# Patient Record
Sex: Female | Born: 1945 | Race: White | Hispanic: No | Marital: Married | Smoking: Never smoker
Health system: Southern US, Community
[De-identification: ages and names within clinical notes are randomized; demographics above are authoritative.]

## PROBLEM LIST (undated history)

## (undated) DIAGNOSIS — T7840XA Allergy, unspecified, initial encounter: Secondary | ICD-10-CM

## (undated) DIAGNOSIS — N809 Endometriosis, unspecified: Secondary | ICD-10-CM

## (undated) DIAGNOSIS — A498 Other bacterial infections of unspecified site: Secondary | ICD-10-CM

## (undated) DIAGNOSIS — E785 Hyperlipidemia, unspecified: Secondary | ICD-10-CM

## (undated) DIAGNOSIS — M810 Age-related osteoporosis without current pathological fracture: Secondary | ICD-10-CM

## (undated) HISTORY — DX: Allergy, unspecified, initial encounter: T78.40XA

## (undated) HISTORY — DX: Age-related osteoporosis without current pathological fracture: M81.0

## (undated) HISTORY — PX: DILATION AND CURETTAGE OF UTERUS: SHX78

## (undated) HISTORY — PX: FOOT SURGERY: SHX648

## (undated) HISTORY — DX: Hyperlipidemia, unspecified: E78.5

## (undated) HISTORY — DX: Other bacterial infections of unspecified site: A49.8

## (undated) HISTORY — DX: Endometriosis, unspecified: N80.9

## (undated) HISTORY — PX: COLONOSCOPY: SHX174

---

## 1998-09-15 ENCOUNTER — Other Ambulatory Visit: Admission: RE | Admit: 1998-09-15 | Discharge: 1998-09-15 | Payer: Self-pay | Admitting: Obstetrics and Gynecology

## 1998-11-17 ENCOUNTER — Other Ambulatory Visit: Admission: RE | Admit: 1998-11-17 | Discharge: 1998-11-17 | Payer: Self-pay | Admitting: Obstetrics and Gynecology

## 1999-10-19 ENCOUNTER — Other Ambulatory Visit: Admission: RE | Admit: 1999-10-19 | Discharge: 1999-10-19 | Payer: Self-pay | Admitting: Obstetrics and Gynecology

## 1999-11-23 ENCOUNTER — Encounter: Payer: Self-pay | Admitting: Obstetrics and Gynecology

## 1999-11-23 ENCOUNTER — Encounter: Admission: RE | Admit: 1999-11-23 | Discharge: 1999-11-23 | Payer: Self-pay | Admitting: Obstetrics and Gynecology

## 2000-12-05 ENCOUNTER — Other Ambulatory Visit: Admission: RE | Admit: 2000-12-05 | Discharge: 2000-12-05 | Payer: Self-pay | Admitting: Obstetrics and Gynecology

## 2001-01-02 ENCOUNTER — Encounter: Admission: RE | Admit: 2001-01-02 | Discharge: 2001-01-02 | Payer: Self-pay | Admitting: Obstetrics and Gynecology

## 2001-01-02 ENCOUNTER — Encounter: Payer: Self-pay | Admitting: Obstetrics and Gynecology

## 2002-02-26 ENCOUNTER — Encounter: Admission: RE | Admit: 2002-02-26 | Discharge: 2002-02-26 | Payer: Self-pay | Admitting: Obstetrics and Gynecology

## 2002-02-26 ENCOUNTER — Encounter: Payer: Self-pay | Admitting: Obstetrics and Gynecology

## 2003-04-22 ENCOUNTER — Encounter: Admission: RE | Admit: 2003-04-22 | Discharge: 2003-04-22 | Payer: Self-pay | Admitting: Obstetrics and Gynecology

## 2003-04-22 ENCOUNTER — Encounter: Payer: Self-pay | Admitting: Obstetrics and Gynecology

## 2003-04-27 ENCOUNTER — Encounter: Payer: Self-pay | Admitting: Obstetrics and Gynecology

## 2003-04-27 ENCOUNTER — Encounter: Admission: RE | Admit: 2003-04-27 | Discharge: 2003-04-27 | Payer: Self-pay | Admitting: Obstetrics and Gynecology

## 2004-04-27 ENCOUNTER — Ambulatory Visit (HOSPITAL_COMMUNITY): Admission: RE | Admit: 2004-04-27 | Discharge: 2004-04-27 | Payer: Self-pay | Admitting: Obstetrics and Gynecology

## 2004-05-21 ENCOUNTER — Encounter: Admission: RE | Admit: 2004-05-21 | Discharge: 2004-05-21 | Payer: Self-pay | Admitting: Specialist

## 2004-12-17 ENCOUNTER — Ambulatory Visit: Payer: Self-pay | Admitting: Internal Medicine

## 2004-12-24 ENCOUNTER — Ambulatory Visit: Payer: Self-pay | Admitting: Internal Medicine

## 2005-06-12 ENCOUNTER — Ambulatory Visit (HOSPITAL_COMMUNITY): Admission: RE | Admit: 2005-06-12 | Discharge: 2005-06-12 | Payer: Self-pay | Admitting: Obstetrics and Gynecology

## 2005-08-26 ENCOUNTER — Encounter (INDEPENDENT_AMBULATORY_CARE_PROVIDER_SITE_OTHER): Payer: Self-pay | Admitting: Specialist

## 2005-08-26 ENCOUNTER — Ambulatory Visit (HOSPITAL_BASED_OUTPATIENT_CLINIC_OR_DEPARTMENT_OTHER): Admission: RE | Admit: 2005-08-26 | Discharge: 2005-08-26 | Payer: Self-pay | Admitting: Otolaryngology

## 2005-08-26 ENCOUNTER — Ambulatory Visit (HOSPITAL_COMMUNITY): Admission: RE | Admit: 2005-08-26 | Discharge: 2005-08-26 | Payer: Self-pay | Admitting: Otolaryngology

## 2006-06-23 ENCOUNTER — Ambulatory Visit (HOSPITAL_COMMUNITY): Admission: RE | Admit: 2006-06-23 | Discharge: 2006-06-23 | Payer: Self-pay | Admitting: Obstetrics and Gynecology

## 2006-11-05 ENCOUNTER — Ambulatory Visit (HOSPITAL_COMMUNITY): Admission: RE | Admit: 2006-11-05 | Discharge: 2006-11-05 | Payer: Self-pay | Admitting: Internal Medicine

## 2007-07-01 ENCOUNTER — Ambulatory Visit (HOSPITAL_COMMUNITY): Admission: RE | Admit: 2007-07-01 | Discharge: 2007-07-01 | Payer: Self-pay | Admitting: Obstetrics and Gynecology

## 2008-07-06 ENCOUNTER — Ambulatory Visit (HOSPITAL_COMMUNITY): Admission: RE | Admit: 2008-07-06 | Discharge: 2008-07-06 | Payer: Self-pay | Admitting: Obstetrics and Gynecology

## 2009-07-19 ENCOUNTER — Ambulatory Visit (HOSPITAL_COMMUNITY): Admission: RE | Admit: 2009-07-19 | Discharge: 2009-07-19 | Payer: Self-pay | Admitting: Obstetrics and Gynecology

## 2010-09-19 ENCOUNTER — Ambulatory Visit (HOSPITAL_COMMUNITY)
Admission: RE | Admit: 2010-09-19 | Discharge: 2010-09-19 | Payer: Self-pay | Source: Home / Self Care | Attending: Obstetrics and Gynecology | Admitting: Obstetrics and Gynecology

## 2010-11-03 ENCOUNTER — Encounter: Payer: Self-pay | Admitting: Obstetrics and Gynecology

## 2010-11-04 ENCOUNTER — Encounter: Payer: Self-pay | Admitting: Obstetrics and Gynecology

## 2011-03-01 NOTE — Op Note (Signed)
Kathy Francis, Kathy Francis                ACCOUNT NO.:  [id number]   MEDICAL RECORD NO.:  [id number]            PATIENT TYPE:   LOCATION:                                 FACILITY:   PHYSICIAN:  Jefry H. Pollyann Kennedy, MD          DATE OF BIRTH:   DATE OF PROCEDURE:  08/26/2005  DATE OF DISCHARGE:                                 OPERATIVE REPORT   PREOPERATIVE DIAGNOSIS:  Right nasoalveolar cyst.   POSTOPERATIVE DIAGNOSIS:  Right nasoalveolar cyst.   PROCEDURE:  Excision of right nasoalveolar cyst using a sublabial approach.   SURGEON:  Jefry H. Pollyann Kennedy, MD   ANESTHESIA:  General endotracheal anesthesia.   COMPLICATIONS:  None.   FINDINGS:  A 2-3 cm cystic mass in the nasoalveolar area without any  invasion into any bone.  There did appear to be some minor bony remodeling  along the alveolar bone just inferior to the piriform aperture.   SPECIMENS TO PATHOLOGY:  Alveolar cyst.   COMPLICATIONS:  None.   BLOOD LOSS:  Minimal.   HISTORY:  This is a 65 year old lady with a slowly emerging mass on the  right side of the face that has caused some cosmetic changes of the nose and  cheek area.  The risks, benefits, alternatives, and complications of the  procedure were explained to the patient.  She seemed to understand and  agrees to surgery.   DESCRIPTION OF PROCEDURE:  The patient was taken to the operating room and  placed on the operating table in the supine position.  Following induction  of general endotracheal anesthesia the right side of the face was prepped  and draped in a standard fashion.  Xylocaine 1% with epinephrine was  infiltrated into the pyriform fossa mucosa. The electrocautery unit was used  to incise the mucosa; and through the superficial layer of muscle down to  the wall of the cyst.  The cyst wall was carefully dissected free of all  surrounding tissue; and it was removed in its entirety. There was leaking of  consents and there was cloudy fluid within the cyst.  The  entire cyst was  removed and was sent  for pathologic evaluation.  Electrocautery was used for completion of  hemostasis.  The wound was irrigated with saline and closed with a running,  interlocking 4-0 Vicryl suture.  The patient was then awakened, extubated,  and transferred to recovery.      Jefry H. Pollyann Kennedy, MD  Electronically Signed     JHR/MEDQ  D:  08/26/2005  T:  08/26/2005  Job:  [id number]

## 2011-08-09 ENCOUNTER — Other Ambulatory Visit (HOSPITAL_COMMUNITY): Payer: Self-pay | Admitting: Obstetrics and Gynecology

## 2011-08-09 DIAGNOSIS — Z1231 Encounter for screening mammogram for malignant neoplasm of breast: Secondary | ICD-10-CM

## 2011-09-23 ENCOUNTER — Ambulatory Visit (HOSPITAL_COMMUNITY): Payer: Self-pay

## 2011-10-23 ENCOUNTER — Ambulatory Visit (HOSPITAL_COMMUNITY)
Admission: RE | Admit: 2011-10-23 | Discharge: 2011-10-23 | Disposition: A | Discharge status: Home / Self Care | Payer: Medicare Other | Source: Ambulatory Visit | Attending: Obstetrics and Gynecology | Admitting: Obstetrics and Gynecology

## 2011-10-23 DIAGNOSIS — Z1231 Encounter for screening mammogram for malignant neoplasm of breast: Secondary | ICD-10-CM | POA: Insufficient documentation

## 2011-11-06 ENCOUNTER — Encounter: Payer: Self-pay | Admitting: Internal Medicine

## 2011-12-02 ENCOUNTER — Encounter (INDEPENDENT_AMBULATORY_CARE_PROVIDER_SITE_OTHER): Payer: Medicare Other | Admitting: Surgery

## 2012-08-13 ENCOUNTER — Encounter: Payer: Self-pay | Admitting: Internal Medicine

## 2012-09-22 ENCOUNTER — Other Ambulatory Visit (HOSPITAL_COMMUNITY): Payer: Self-pay | Admitting: Obstetrics and Gynecology

## 2012-09-22 DIAGNOSIS — Z1231 Encounter for screening mammogram for malignant neoplasm of breast: Secondary | ICD-10-CM

## 2012-11-04 ENCOUNTER — Ambulatory Visit (HOSPITAL_COMMUNITY): Payer: Medicare Other

## 2012-11-18 ENCOUNTER — Ambulatory Visit (HOSPITAL_COMMUNITY)
Admission: RE | Admit: 2012-11-18 | Discharge: 2012-11-18 | Disposition: A | Discharge status: Home / Self Care | Payer: Medicare Other | Source: Ambulatory Visit | Attending: Obstetrics and Gynecology | Admitting: Obstetrics and Gynecology

## 2012-11-18 DIAGNOSIS — Z1231 Encounter for screening mammogram for malignant neoplasm of breast: Secondary | ICD-10-CM | POA: Insufficient documentation

## 2013-10-19 ENCOUNTER — Other Ambulatory Visit (HOSPITAL_COMMUNITY): Payer: Self-pay | Admitting: Obstetrics and Gynecology

## 2013-10-19 DIAGNOSIS — Z1231 Encounter for screening mammogram for malignant neoplasm of breast: Secondary | ICD-10-CM

## 2013-12-01 ENCOUNTER — Ambulatory Visit (HOSPITAL_COMMUNITY): Payer: Medicare Other

## 2013-12-29 ENCOUNTER — Ambulatory Visit (HOSPITAL_COMMUNITY)
Admission: RE | Admit: 2013-12-29 | Discharge: 2013-12-29 | Disposition: A | Discharge status: Home / Self Care | Payer: Medicare Other | Source: Ambulatory Visit | Attending: Obstetrics and Gynecology | Admitting: Obstetrics and Gynecology

## 2013-12-29 DIAGNOSIS — Z1231 Encounter for screening mammogram for malignant neoplasm of breast: Secondary | ICD-10-CM | POA: Insufficient documentation

## 2014-12-06 ENCOUNTER — Other Ambulatory Visit (HOSPITAL_COMMUNITY): Payer: Self-pay | Admitting: Obstetrics and Gynecology

## 2014-12-06 DIAGNOSIS — Z1231 Encounter for screening mammogram for malignant neoplasm of breast: Secondary | ICD-10-CM

## 2015-01-04 ENCOUNTER — Ambulatory Visit (HOSPITAL_COMMUNITY): Payer: Self-pay

## 2015-01-18 ENCOUNTER — Ambulatory Visit (HOSPITAL_COMMUNITY)
Admission: RE | Admit: 2015-01-18 | Discharge: 2015-01-18 | Disposition: A | Discharge status: Home / Self Care | Payer: PPO | Source: Ambulatory Visit | Attending: Obstetrics and Gynecology | Admitting: Obstetrics and Gynecology

## 2015-01-18 DIAGNOSIS — Z1231 Encounter for screening mammogram for malignant neoplasm of breast: Secondary | ICD-10-CM | POA: Insufficient documentation

## 2015-10-23 DIAGNOSIS — Z6822 Body mass index (BMI) 22.0-22.9, adult: Secondary | ICD-10-CM | POA: Diagnosis not present

## 2015-10-23 DIAGNOSIS — J0100 Acute maxillary sinusitis, unspecified: Secondary | ICD-10-CM | POA: Diagnosis not present

## 2015-10-30 DIAGNOSIS — R197 Diarrhea, unspecified: Secondary | ICD-10-CM | POA: Diagnosis not present

## 2015-10-30 DIAGNOSIS — Z6822 Body mass index (BMI) 22.0-22.9, adult: Secondary | ICD-10-CM | POA: Diagnosis not present

## 2015-10-30 DIAGNOSIS — R110 Nausea: Secondary | ICD-10-CM | POA: Diagnosis not present

## 2015-10-30 DIAGNOSIS — J0100 Acute maxillary sinusitis, unspecified: Secondary | ICD-10-CM | POA: Diagnosis not present

## 2015-11-03 DIAGNOSIS — R197 Diarrhea, unspecified: Secondary | ICD-10-CM | POA: Diagnosis not present

## 2015-11-14 ENCOUNTER — Encounter: Payer: Self-pay | Admitting: Internal Medicine

## 2015-11-17 DIAGNOSIS — M810 Age-related osteoporosis without current pathological fracture: Secondary | ICD-10-CM | POA: Diagnosis not present

## 2015-11-17 DIAGNOSIS — A047 Enterocolitis due to Clostridium difficile: Secondary | ICD-10-CM | POA: Diagnosis not present

## 2015-11-17 DIAGNOSIS — Z6821 Body mass index (BMI) 21.0-21.9, adult: Secondary | ICD-10-CM | POA: Diagnosis not present

## 2015-11-24 DIAGNOSIS — H2513 Age-related nuclear cataract, bilateral: Secondary | ICD-10-CM | POA: Diagnosis not present

## 2015-11-24 DIAGNOSIS — H25013 Cortical age-related cataract, bilateral: Secondary | ICD-10-CM | POA: Diagnosis not present

## 2016-01-08 ENCOUNTER — Encounter: Payer: Self-pay | Admitting: Internal Medicine

## 2016-01-08 ENCOUNTER — Ambulatory Visit (INDEPENDENT_AMBULATORY_CARE_PROVIDER_SITE_OTHER): Payer: PPO | Admitting: Internal Medicine

## 2016-01-08 VITALS — BP 108/70 | HR 72 | Ht 63.0 in | Wt 120.0 lb

## 2016-01-08 DIAGNOSIS — A498 Other bacterial infections of unspecified site: Secondary | ICD-10-CM

## 2016-01-08 DIAGNOSIS — B9689 Other specified bacterial agents as the cause of diseases classified elsewhere: Secondary | ICD-10-CM

## 2016-01-08 DIAGNOSIS — R194 Change in bowel habit: Secondary | ICD-10-CM | POA: Diagnosis not present

## 2016-01-08 DIAGNOSIS — Z1211 Encounter for screening for malignant neoplasm of colon: Secondary | ICD-10-CM | POA: Diagnosis not present

## 2016-01-08 MED ORDER — NA SULFATE-K SULFATE-MG SULF 17.5-3.13-1.6 GM/177ML PO SOLN: 1.0000 | Freq: Once | ORAL | Status: DC

## 2016-01-08 NOTE — Patient Instructions (Signed)

## 2016-01-08 NOTE — Progress Notes (Signed)
HISTORY OF PRESENT ILLNESS:  Kathy Francis is a 70 y.o. female who was last seen in this office March 2006 when she underwent colonoscopy to evaluate diarrhea and abdominal bloating. Examination was normal including intubation of terminal ileum. Random biopsies were normal. She was felt to have IBS prescribed Levsin. Patient states that she was in her usual state of good health until December when after receiving antibiotics for upper respiratory infection developed Clostridium difficile associated diarrhea. She was treated with a ten-day course of Flagyl and improved. She schedules his office evaluation to discuss Clostridium difficile. She has multiple multiple questions including etiology, treatment, recurrence, and threat spread to others. She is also due for repeat screening colonoscopy. She wishes to schedule the examination. She has not had a tendency toward intermittent loose stools. This continues. However, no diarrhea at present. No abdominal pain. Occasional bloating. Weight is stable. Review of outside records from Dr. Sharlett Iles shows normal blood counts in February. She is taking daily probiotic.  REVIEW OF SYSTEMS:  All non-GI ROS negative except for sinus allergy  Past Medical History  Diagnosis Date  . Hyperlipidemia   . Clostridium difficile infection   . Osteoporosis   . Endometriosis     Past Surgical History  Procedure Laterality Date  . Dilation and curettage of uterus      endometriosis  . Foot surgery      left    Social History SINAYA Francis  reports that she has never smoked. She does not have any smokeless tobacco history on file. Her alcohol and drug histories are not on file.  family history includes Breast cancer in her sister; Diabetes in her father.  Allergies  Allergen Reactions  . Augmentin [Amoxicillin-Pot Clavulanate] Diarrhea       PHYSICAL EXAMINATION: Vital signs: BP 108/70 mmHg  Pulse 72  Ht 5\' 3"  (1.6 m)  Wt 120 lb (54.432 kg)  BMI  21.26 kg/m2  Constitutional: generally well-appearing, no acute distress Psychiatric: alert and oriented x3, cooperative Eyes: extraocular movements intact, anicteric, conjunctiva pink Mouth: oral pharynx moist, no lesions Neck: supple no lymphadenopathy Cardiovascular: heart regular rate and rhythm, no murmur Lungs: clear to auscultation bilaterally Abdomen: soft, nontender, nondistended, no obvious ascites, no peritoneal signs, normal bowel sounds, no organomegaly Rectal:Deferred until colonoscopy Extremities: no clubbing cyanosis or lower extremity edema bilaterally Skin: no lesions on visible extremities Neuro: No focal deficits. Cranial nerves intact  ASSESSMENT:  #1. History of Clostridium difficile diarrhea post antibiotics. Resolved after ten-day course of metronidazole without recurrence #2. Prior colonoscopy with ileal intubation 2006 was normal.   PLAN:  #1. Extensive discussion (30 minutes of counseling alone on this topic) on Clostridium difficile infections. All patient questions (she had a list) were answered to her satisfaction. Advised with regards to future antibiotics and possible recurrence. She understands #2. Screening colonoscopy.The nature of the procedure, as well as the risks, benefits, and alternatives were carefully and thoroughly reviewed with the patient. Ample time for discussion and questions allowed. The patient understood, was satisfied, and agreed to proceed.

## 2016-01-09 DIAGNOSIS — L570 Actinic keratosis: Secondary | ICD-10-CM | POA: Diagnosis not present

## 2016-01-09 DIAGNOSIS — L821 Other seborrheic keratosis: Secondary | ICD-10-CM | POA: Diagnosis not present

## 2016-01-09 DIAGNOSIS — D225 Melanocytic nevi of trunk: Secondary | ICD-10-CM | POA: Diagnosis not present

## 2016-01-09 DIAGNOSIS — D2271 Melanocytic nevi of right lower limb, including hip: Secondary | ICD-10-CM | POA: Diagnosis not present

## 2016-01-09 DIAGNOSIS — L814 Other melanin hyperpigmentation: Secondary | ICD-10-CM | POA: Diagnosis not present

## 2016-03-01 ENCOUNTER — Encounter: Payer: Self-pay | Admitting: Internal Medicine

## 2016-03-12 ENCOUNTER — Telehealth: Payer: Self-pay | Admitting: Internal Medicine

## 2016-03-12 NOTE — Telephone Encounter (Signed)
Patient wants to reschedule her procedure since her hemorrhoids are acting up. Offered OV with extender but patient does not want an OV. Rescheduled procedure to August per her request.

## 2016-03-15 ENCOUNTER — Encounter: Payer: PPO | Admitting: Internal Medicine

## 2016-04-19 ENCOUNTER — Other Ambulatory Visit: Payer: Self-pay | Admitting: Internal Medicine

## 2016-04-19 DIAGNOSIS — J0100 Acute maxillary sinusitis, unspecified: Secondary | ICD-10-CM

## 2016-04-25 ENCOUNTER — Ambulatory Visit
Admission: RE | Admit: 2016-04-25 | Discharge: 2016-04-25 | Disposition: A | Discharge status: Home / Self Care | Payer: PPO | Source: Ambulatory Visit | Attending: Internal Medicine | Admitting: Internal Medicine

## 2016-04-25 DIAGNOSIS — J0100 Acute maxillary sinusitis, unspecified: Secondary | ICD-10-CM

## 2016-04-25 DIAGNOSIS — K0889 Other specified disorders of teeth and supporting structures: Secondary | ICD-10-CM | POA: Diagnosis not present

## 2016-05-17 ENCOUNTER — Encounter: Payer: PPO | Admitting: Internal Medicine

## 2016-05-24 DIAGNOSIS — R87810 Cervical high risk human papillomavirus (HPV) DNA test positive: Secondary | ICD-10-CM | POA: Diagnosis not present

## 2016-05-24 DIAGNOSIS — Z1231 Encounter for screening mammogram for malignant neoplasm of breast: Secondary | ICD-10-CM | POA: Diagnosis not present

## 2016-06-24 ENCOUNTER — Telehealth: Payer: Self-pay | Admitting: Internal Medicine

## 2016-06-24 DIAGNOSIS — K648 Other hemorrhoids: Secondary | ICD-10-CM | POA: Diagnosis not present

## 2016-06-24 DIAGNOSIS — Z6821 Body mass index (BMI) 21.0-21.9, adult: Secondary | ICD-10-CM | POA: Diagnosis not present

## 2016-06-24 NOTE — Telephone Encounter (Signed)
Pt states she started having some rectal bleeding at the end of last week. Her PCP gave her some suppositories and the bleeding did stop. She saw the PA today at was told her hemorrhoid was better and to go ahead with her colonoscopy tomorrow and Dr. Henrene Pastor could look at it at the time of colon. Pt wanted to call and let Dr. Henrene Pastor know. Note sent to Dr. Henrene Pastor.

## 2016-06-25 ENCOUNTER — Ambulatory Visit (AMBULATORY_SURGERY_CENTER): Payer: PPO | Admitting: Internal Medicine

## 2016-06-25 ENCOUNTER — Encounter: Payer: Self-pay | Admitting: Internal Medicine

## 2016-06-25 VITALS — BP 118/72 | HR 82 | Temp 96.6°F | Resp 15 | Ht 63.0 in | Wt 120.0 lb

## 2016-06-25 DIAGNOSIS — Z1211 Encounter for screening for malignant neoplasm of colon: Secondary | ICD-10-CM

## 2016-06-25 MED ORDER — SODIUM CHLORIDE 0.9 % IV SOLN: 500.0000 mL | INTRAVENOUS | Status: AC

## 2016-06-25 NOTE — Op Note (Signed)
[name] Patient Name: Kathy Francis Procedure Date: 06/25/2016 8:03 AM MRN: [id number] Endoscopist: Docia Chuck. Henrene Pastor , MD Age: 70 Referring MD:  Date of Birth: [birth year] Gender: Female Account #: [id number] Procedure:                Colonoscopy Indications:              Screening for colorectal malignant neoplasm. Index                            exam March 2006 was normal Medicines:                Monitored Anesthesia Care Procedure:                Pre-Anesthesia Assessment:                           - Prior to the procedure, a History and Physical                            was performed, and patient medications and                            allergies were reviewed. The patient's tolerance of                            previous anesthesia was also reviewed. The risks                            and benefits of the procedure and the sedation                            options and risks were discussed with the patient.                            All questions were answered, and informed consent                            was obtained. Prior Anticoagulants: The patient has                            taken no previous anticoagulant or antiplatelet                            agents. ASA Grade Assessment: II - A patient with                            mild systemic disease. After reviewing the risks                            and benefits, the patient was deemed in                            satisfactory condition to undergo the procedure.  After obtaining informed consent, the colonoscope                            was passed under direct vision. Throughout the                            procedure, the patient's blood pressure, pulse, and                            oxygen saturations were monitored continuously. The                            Model CF-HQ190L [id number](SN#[id number]) scope was introduced                            through the anus and advanced to  the the cecum,                            identified by appendiceal orifice and ileocecal                            valve. The ileocecal valve, appendiceal orifice,                            and rectum were photographed. The quality of the                            bowel preparation was excellent. The colonoscopy                            was performed without difficulty. The patient                            tolerated the procedure well. The bowel preparation                            used was SUPREP. Scope In: 8:09:54 AM Scope Out: 8:24:23 AM Scope Withdrawal Time: 0 hours 10 minutes 34 seconds  Total Procedure Duration: 0 hours 14 minutes 29 seconds  Findings:                 A single medium-mouthed diverticulum was found in                            the ascending colon.                           Internal hemorrhoids were found during retroflexion.                           The exam was otherwise without abnormality on                            direct and retroflexion views. Complications:            No immediate  complications. Estimated blood loss:                            None. Estimated Blood Loss:     Estimated blood loss: none. Impression:               - Diverticulosis in the ascending colon.                           - Internal hemorrhoids.                           - The examination was otherwise normal on direct                            and retroflexion views.                           - No specimens collected. Recommendation:           - Repeat colonoscopy in 10 years for screening                            purposes.                           - Patient has a contact number available for                            emergencies. The signs and symptoms of potential                            delayed complications were discussed with the                            patient. Return to normal activities tomorrow.                            Written discharge  instructions were provided to the                            patient.                           - Resume previous diet.                           - Continue present medications. Docia Chuck. Henrene Pastor, MD 06/25/2016 8:36:57 AM This report has been signed electronically.

## 2016-06-25 NOTE — Progress Notes (Signed)
Report to PACU, RN, vss, BBS= Clear.  

## 2016-06-25 NOTE — Patient Instructions (Signed)
YOU HAD AN ENDOSCOPIC PROCEDURE TODAY AT [facility]:   Refer to the procedure report that was given to you for any specific questions about what was found during the examination.  If the procedure report does not answer your questions, please call your gastroenterologist to clarify.  If you requested that your care partner not be given the details of your procedure findings, then the procedure report has been included in a sealed envelope for you to review at your convenience later.  YOU SHOULD EXPECT: Some feelings of bloating in the abdomen. Passage of more gas than usual.  Walking can help get rid of the air that was put into your GI tract during the procedure and reduce the bloating. If you had a lower endoscopy (such as a colonoscopy or flexible sigmoidoscopy) you may notice spotting of blood in your stool or on the toilet paper. If you underwent a bowel prep for your procedure, you may not have a normal bowel movement for a few days.  Please Note:  You might notice some irritation and congestion in your nose or some drainage.  This is from the oxygen used during your procedure.  There is no need for concern and it should clear up in a day or so.  SYMPTOMS TO REPORT IMMEDIATELY:   Following lower endoscopy (colonoscopy or flexible sigmoidoscopy):  Excessive amounts of blood in the stool  Significant tenderness or worsening of abdominal pains  Swelling of the abdomen that is new, acute  Fever of 100F or higher   For urgent or emergent issues, a gastroenterologist can be reached at any hour by calling [phone].   DIET:  We do recommend a small meal at first, but then you may proceed to your regular diet.  Drink plenty of fluids but you should avoid alcoholic beverages for 24 hours. Try to increase the fiber in your diet, and drink plenty of water.  ACTIVITY:  You should plan to take it easy for the rest of today and you should NOT DRIVE or use heavy machinery until  tomorrow (because of the sedation medicines used during the test).    FOLLOW UP: Our staff will call the number listed on your records the next business day following your procedure to check on you and address any questions or concerns that you may have regarding the information given to you following your procedure. If we do not reach you, we will leave a message.  However, if you are feeling well and you are not experiencing any problems, there is no need to return our call.  We will assume that you have returned to your regular daily activities without incident.  If any biopsies were taken you will be contacted by phone or by letter within the next 1-3 weeks.  Please call us at [phone] if you have not heard about the biopsies in 3 weeks.    SIGNATURES/CONFIDENTIALITY: You and/or your care partner have signed paperwork which will be entered into your electronic medical record.  These signatures attest to the fact that that the information above on your After Visit Summary has been reviewed and is understood.  Full responsibility of the confidentiality of this discharge information lies with you and/or your care-partner.    Try using prep H for your hemorrhoids, and eat plenty of fiber.  You may also look into banding if they are bothering you a lot.  Read all of the handouts given to you by your recovery room nurse.  Thank-you for choosing [facility] for your healthcare needs today.

## 2016-06-26 NOTE — Telephone Encounter (Signed)
  Follow up Call-  Call back number 06/25/2016  Post procedure Call Back phone  # [phone]  Permission to leave phone message Yes  Some recent data might be hidden     Patient questions:  Do you have a fever, pain , or abdominal swelling? No. Pain Score  0 *  Have you tolerated food without any problems? Yes.    Have you been able to return to your normal activities? Yes.    Do you have any questions about your discharge instructions: Diet   No. Medications  No. Follow up visit  No.  Do you have questions or concerns about your Care? No.  Actions: * If pain score is 4 or above: No action needed, pain <4.

## 2016-06-29 DIAGNOSIS — Z23 Encounter for immunization: Secondary | ICD-10-CM | POA: Diagnosis not present

## 2016-08-09 DIAGNOSIS — E784 Other hyperlipidemia: Secondary | ICD-10-CM | POA: Diagnosis not present

## 2016-08-09 DIAGNOSIS — Z79899 Other long term (current) drug therapy: Secondary | ICD-10-CM | POA: Diagnosis not present

## 2016-08-09 DIAGNOSIS — M810 Age-related osteoporosis without current pathological fracture: Secondary | ICD-10-CM | POA: Diagnosis not present

## 2016-08-09 DIAGNOSIS — Z833 Family history of diabetes mellitus: Secondary | ICD-10-CM | POA: Diagnosis not present

## 2016-08-16 DIAGNOSIS — Z1389 Encounter for screening for other disorder: Secondary | ICD-10-CM | POA: Diagnosis not present

## 2016-08-16 DIAGNOSIS — Z6821 Body mass index (BMI) 21.0-21.9, adult: Secondary | ICD-10-CM | POA: Diagnosis not present

## 2016-08-16 DIAGNOSIS — Z833 Family history of diabetes mellitus: Secondary | ICD-10-CM | POA: Diagnosis not present

## 2016-08-16 DIAGNOSIS — M810 Age-related osteoporosis without current pathological fracture: Secondary | ICD-10-CM | POA: Diagnosis not present

## 2016-08-16 DIAGNOSIS — E784 Other hyperlipidemia: Secondary | ICD-10-CM | POA: Diagnosis not present

## 2016-08-16 DIAGNOSIS — M25512 Pain in left shoulder: Secondary | ICD-10-CM | POA: Diagnosis not present

## 2016-08-16 DIAGNOSIS — F5104 Psychophysiologic insomnia: Secondary | ICD-10-CM | POA: Diagnosis not present

## 2016-08-16 DIAGNOSIS — Z0000 Encounter for general adult medical examination without abnormal findings: Secondary | ICD-10-CM | POA: Diagnosis not present

## 2016-08-16 DIAGNOSIS — Z1212 Encounter for screening for malignant neoplasm of rectum: Secondary | ICD-10-CM | POA: Diagnosis not present

## 2016-08-21 DIAGNOSIS — M25512 Pain in left shoulder: Secondary | ICD-10-CM | POA: Diagnosis not present

## 2016-08-27 DIAGNOSIS — M25512 Pain in left shoulder: Secondary | ICD-10-CM | POA: Diagnosis not present

## 2016-08-29 DIAGNOSIS — M25512 Pain in left shoulder: Secondary | ICD-10-CM | POA: Diagnosis not present

## 2016-09-03 DIAGNOSIS — M25512 Pain in left shoulder: Secondary | ICD-10-CM | POA: Diagnosis not present

## 2016-09-17 DIAGNOSIS — M7542 Impingement syndrome of left shoulder: Secondary | ICD-10-CM | POA: Diagnosis not present

## 2016-11-07 DIAGNOSIS — R21 Rash and other nonspecific skin eruption: Secondary | ICD-10-CM | POA: Diagnosis not present

## 2016-11-07 DIAGNOSIS — M545 Low back pain: Secondary | ICD-10-CM | POA: Diagnosis not present

## 2016-11-07 DIAGNOSIS — Z6820 Body mass index (BMI) 20.0-20.9, adult: Secondary | ICD-10-CM | POA: Diagnosis not present

## 2016-11-07 DIAGNOSIS — R05 Cough: Secondary | ICD-10-CM | POA: Diagnosis not present

## 2016-11-07 DIAGNOSIS — E784 Other hyperlipidemia: Secondary | ICD-10-CM | POA: Diagnosis not present

## 2016-12-16 DIAGNOSIS — M7542 Impingement syndrome of left shoulder: Secondary | ICD-10-CM | POA: Diagnosis not present

## 2016-12-24 DIAGNOSIS — M7542 Impingement syndrome of left shoulder: Secondary | ICD-10-CM | POA: Diagnosis not present

## 2017-01-01 DIAGNOSIS — M7542 Impingement syndrome of left shoulder: Secondary | ICD-10-CM | POA: Diagnosis not present

## 2017-05-13 DIAGNOSIS — Z6821 Body mass index (BMI) 21.0-21.9, adult: Secondary | ICD-10-CM | POA: Diagnosis not present

## 2017-05-13 DIAGNOSIS — K644 Residual hemorrhoidal skin tags: Secondary | ICD-10-CM | POA: Diagnosis not present

## 2017-05-30 DIAGNOSIS — Z1231 Encounter for screening mammogram for malignant neoplasm of breast: Secondary | ICD-10-CM | POA: Diagnosis not present

## 2017-05-30 DIAGNOSIS — Z1389 Encounter for screening for other disorder: Secondary | ICD-10-CM | POA: Diagnosis not present

## 2017-05-30 DIAGNOSIS — Z130 Encounter for screening for diseases of the blood and blood-forming organs and certain disorders involving the immune mechanism: Secondary | ICD-10-CM | POA: Diagnosis not present

## 2017-05-30 DIAGNOSIS — N882 Stricture and stenosis of cervix uteri: Secondary | ICD-10-CM | POA: Diagnosis not present

## 2017-05-30 DIAGNOSIS — Z124 Encounter for screening for malignant neoplasm of cervix: Secondary | ICD-10-CM | POA: Diagnosis not present

## 2017-07-26 DIAGNOSIS — Z23 Encounter for immunization: Secondary | ICD-10-CM | POA: Diagnosis not present

## 2017-08-19 DIAGNOSIS — E7849 Other hyperlipidemia: Secondary | ICD-10-CM | POA: Diagnosis not present

## 2017-08-19 DIAGNOSIS — M810 Age-related osteoporosis without current pathological fracture: Secondary | ICD-10-CM | POA: Diagnosis not present

## 2017-08-19 DIAGNOSIS — R82998 Other abnormal findings in urine: Secondary | ICD-10-CM | POA: Diagnosis not present

## 2017-08-22 DIAGNOSIS — M25512 Pain in left shoulder: Secondary | ICD-10-CM | POA: Diagnosis not present

## 2017-08-22 DIAGNOSIS — F5104 Psychophysiologic insomnia: Secondary | ICD-10-CM | POA: Diagnosis not present

## 2017-08-22 DIAGNOSIS — E7849 Other hyperlipidemia: Secondary | ICD-10-CM | POA: Diagnosis not present

## 2017-08-22 DIAGNOSIS — Z1389 Encounter for screening for other disorder: Secondary | ICD-10-CM | POA: Diagnosis not present

## 2017-08-22 DIAGNOSIS — M810 Age-related osteoporosis without current pathological fracture: Secondary | ICD-10-CM | POA: Diagnosis not present

## 2017-08-22 DIAGNOSIS — Z6822 Body mass index (BMI) 22.0-22.9, adult: Secondary | ICD-10-CM | POA: Diagnosis not present

## 2017-08-22 DIAGNOSIS — Z0000 Encounter for general adult medical examination without abnormal findings: Secondary | ICD-10-CM | POA: Diagnosis not present

## 2017-09-08 DIAGNOSIS — Z1212 Encounter for screening for malignant neoplasm of rectum: Secondary | ICD-10-CM | POA: Diagnosis not present

## 2018-01-02 DIAGNOSIS — L820 Inflamed seborrheic keratosis: Secondary | ICD-10-CM | POA: Diagnosis not present

## 2018-01-02 DIAGNOSIS — L814 Other melanin hyperpigmentation: Secondary | ICD-10-CM | POA: Diagnosis not present

## 2018-01-02 DIAGNOSIS — L603 Nail dystrophy: Secondary | ICD-10-CM | POA: Diagnosis not present

## 2018-01-02 DIAGNOSIS — D2262 Melanocytic nevi of left upper limb, including shoulder: Secondary | ICD-10-CM | POA: Diagnosis not present

## 2018-01-02 DIAGNOSIS — D2271 Melanocytic nevi of right lower limb, including hip: Secondary | ICD-10-CM | POA: Diagnosis not present

## 2018-01-02 DIAGNOSIS — L821 Other seborrheic keratosis: Secondary | ICD-10-CM | POA: Diagnosis not present

## 2018-02-26 IMAGING — CT CT MAXILLOFACIAL W/O CM
2 of 3 series · 15 of 37 positions shown, 18 images · non-contrast
Comparison: None.

CLINICAL DATA: Teeth pain. ? Sinusitis. Symptoms x 4 wks. Facial
pain and swelling. Hx nasal cyst removed years ago. No hx ca. No
prev CT.

EXAM:
CT MAXILLOFACIAL WITHOUT CONTRAST
TECHNIQUE: Multidetector CT imaging of the maxillofacial structures was
performed. Multiplanar CT image reconstructions were also generated.
A small metallic BB was placed on the right temple in order to
reliably differentiate right from left.

[Series 601: coronal facial · coronal · 0.33mm/px · 3 of 71 slices shown]
[im 24/71  bone]
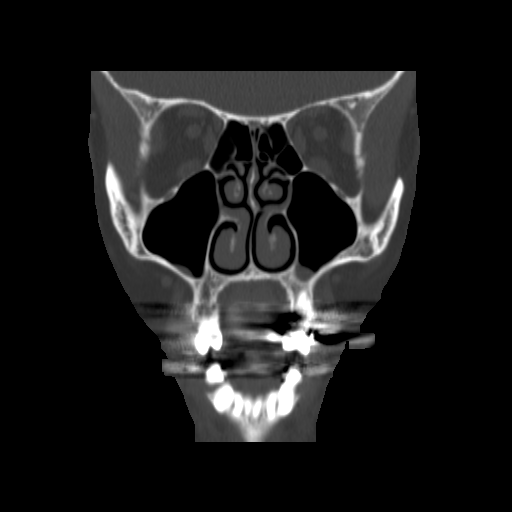
[im 36/71  bone]
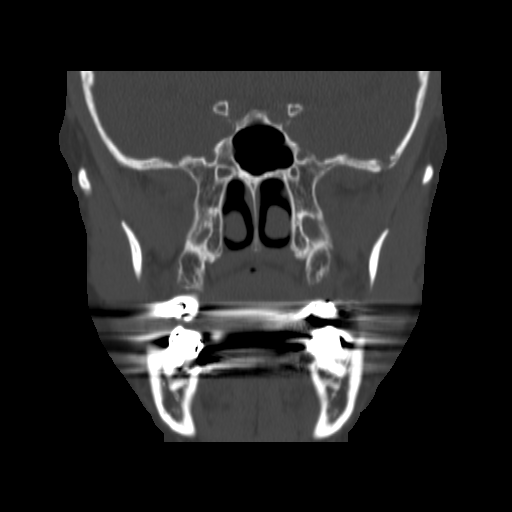
[im 47/71  bone]
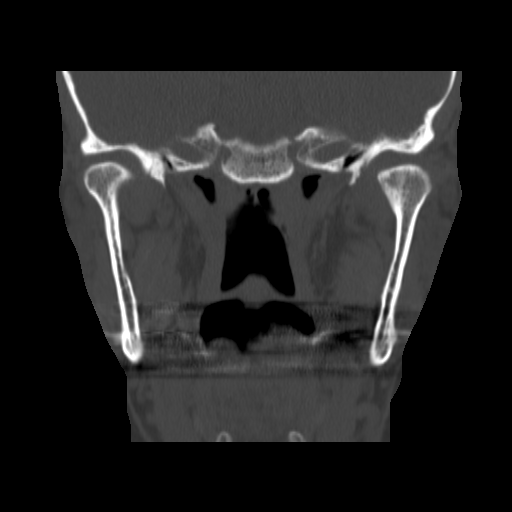

[Series 602: sagittal facial · sagittal · 0.33mm/px · 12 of 81 slices shown, 15 images]
[im 5/81  brain]
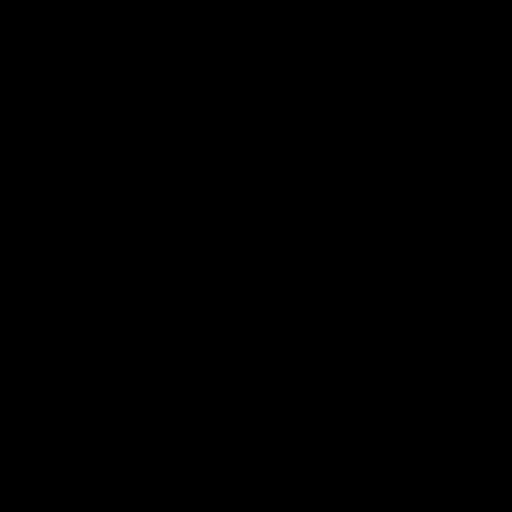
[im 5/81  bone]
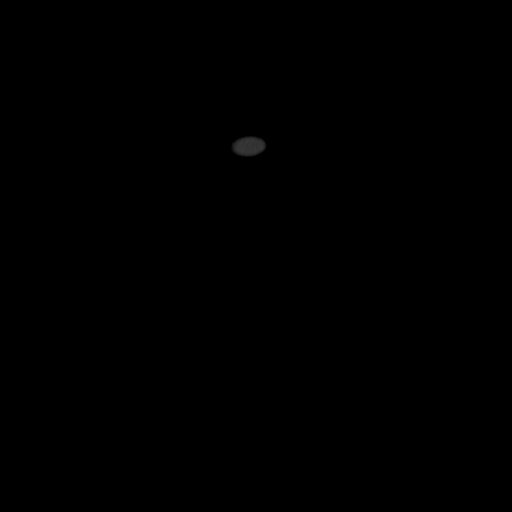
[im 14/81  bone]
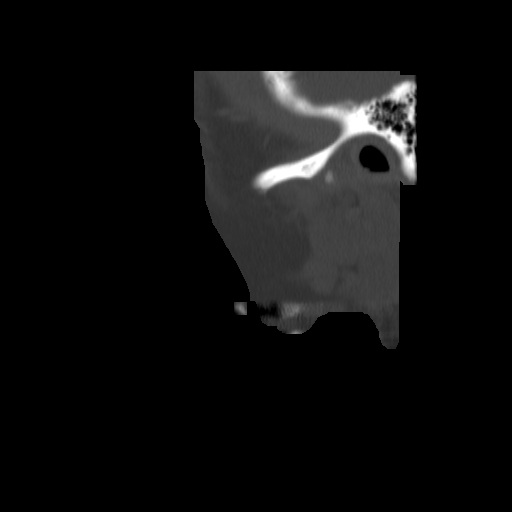
[im 18/81  bone]
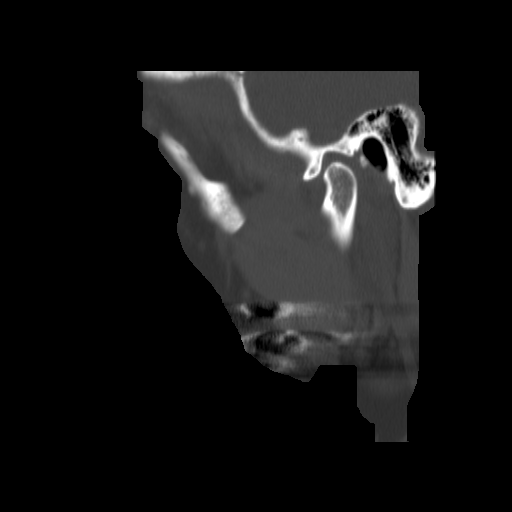
[im 23/81  bone]
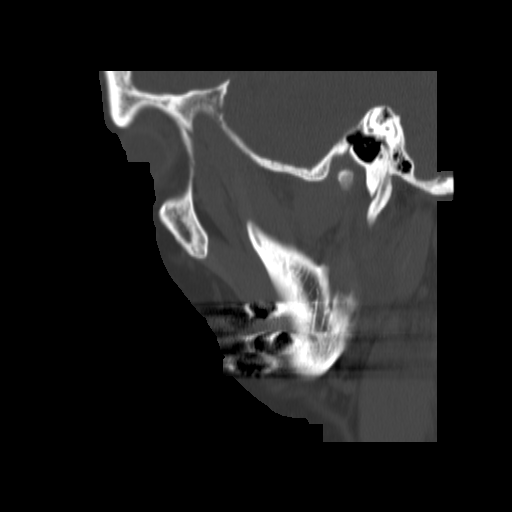
[im 32/81  brain]
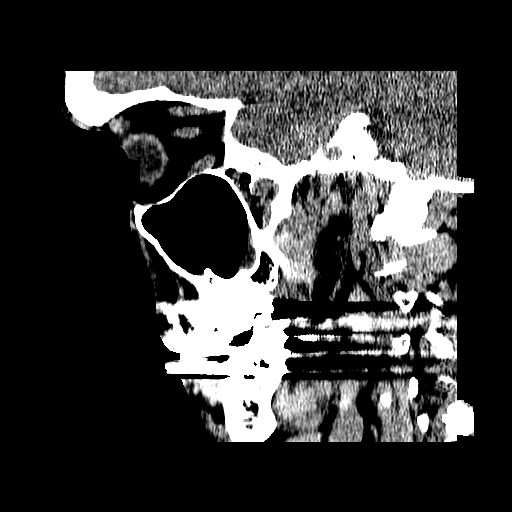
[im 32/81  bone]
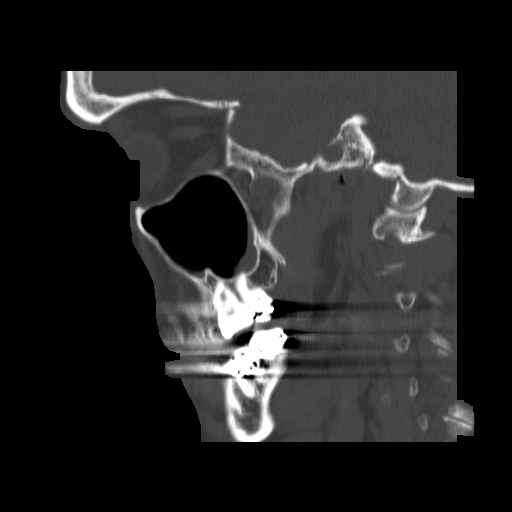
[im 36/81  bone]
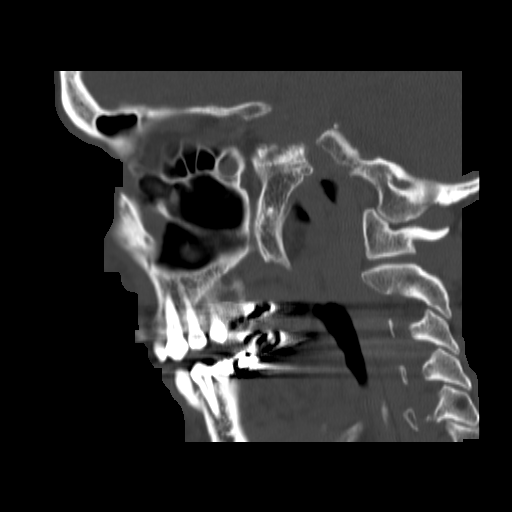
[im 45/81  bone]
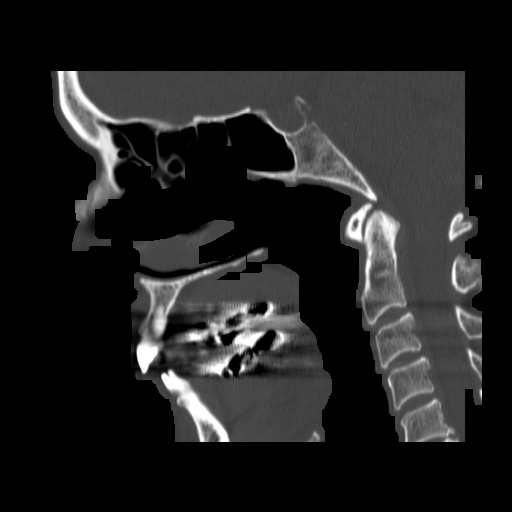
[im 49/81  bone]
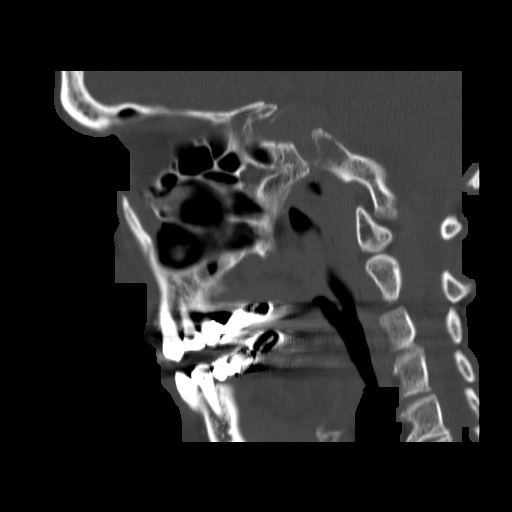
[im 58/81  brain]
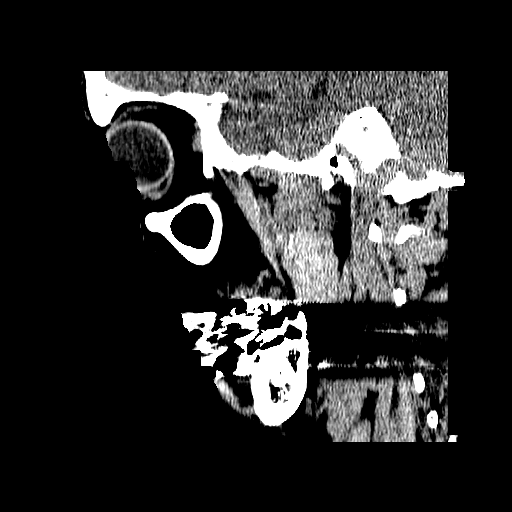
[im 58/81  bone]
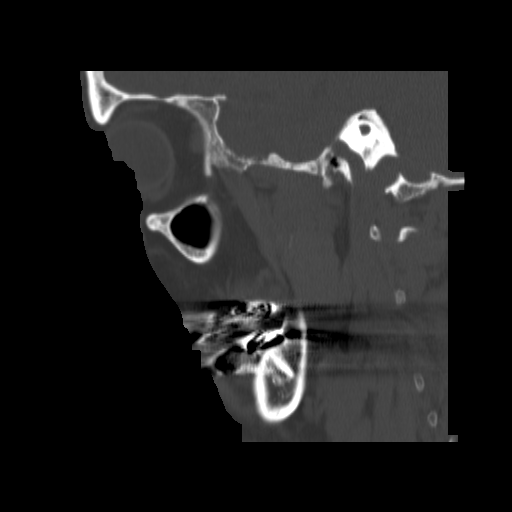
[im 63/81  bone]
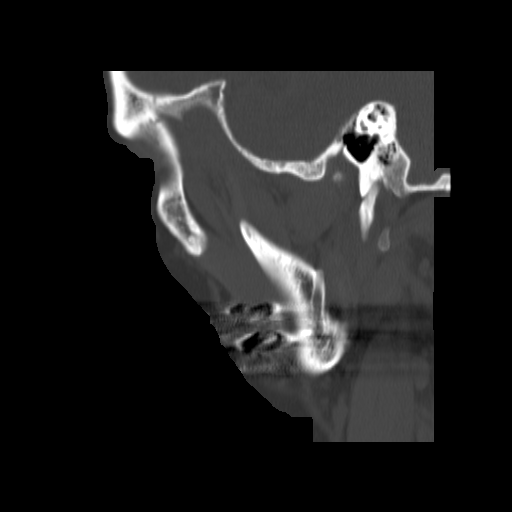
[im 67/81  bone]
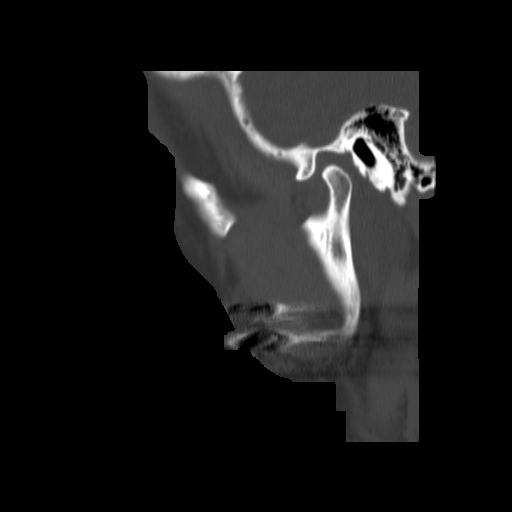
[im 76/81  bone]
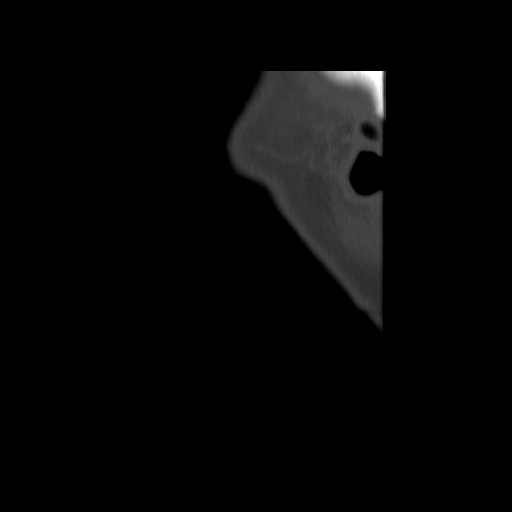

[15 of 37 positions shown; findings below may reference images not displayed]

FINDINGS: Multiple dental fillings create artifacts somewhat limiting the
examination of the teeth and oral cavity. Allowing for this, there
is no periapical lucency to suggest a root abscess. There is no
adjacent soft tissue inflammation or fluid collection to suggest an
abscess.

No fracture. No bone lesion. Minimal dependent fluid noted in the
right maxillary sinus. Minor mucosal thickening in the maxillary
sinuses. Remaining sinuses, mastoid air cells and middle ear
cavities are clear.

Limited intracranial evaluation is unremarkable.

Normal globes and orbits.

No soft tissue masses or adenopathy.
IMPRESSION: 1. Somewhat limited study due to artifact from dental hardware.
Allowing for this, there is no evidence of a dental abscess.
2. Minimal dependent fluid in the right maxillary sinus with minor
maxillary sinus mucosal thickening bilaterally. No other sinus
disease.
3. No other abnormalities. The source of this patient's pain is
unclear.

## 2018-03-20 DIAGNOSIS — H25013 Cortical age-related cataract, bilateral: Secondary | ICD-10-CM | POA: Diagnosis not present

## 2018-03-20 DIAGNOSIS — H2513 Age-related nuclear cataract, bilateral: Secondary | ICD-10-CM | POA: Diagnosis not present

## 2018-07-11 DIAGNOSIS — Z23 Encounter for immunization: Secondary | ICD-10-CM | POA: Diagnosis not present

## 2018-07-17 DIAGNOSIS — Z1231 Encounter for screening mammogram for malignant neoplasm of breast: Secondary | ICD-10-CM | POA: Diagnosis not present

## 2018-07-17 DIAGNOSIS — Z01419 Encounter for gynecological examination (general) (routine) without abnormal findings: Secondary | ICD-10-CM | POA: Diagnosis not present

## 2018-07-17 DIAGNOSIS — Z130 Encounter for screening for diseases of the blood and blood-forming organs and certain disorders involving the immune mechanism: Secondary | ICD-10-CM | POA: Diagnosis not present

## 2018-07-17 DIAGNOSIS — Z1389 Encounter for screening for other disorder: Secondary | ICD-10-CM | POA: Diagnosis not present

## 2018-08-21 DIAGNOSIS — R82998 Other abnormal findings in urine: Secondary | ICD-10-CM | POA: Diagnosis not present

## 2018-08-21 DIAGNOSIS — E7849 Other hyperlipidemia: Secondary | ICD-10-CM | POA: Diagnosis not present

## 2018-08-21 DIAGNOSIS — M810 Age-related osteoporosis without current pathological fracture: Secondary | ICD-10-CM | POA: Diagnosis not present

## 2018-08-21 DIAGNOSIS — Z833 Family history of diabetes mellitus: Secondary | ICD-10-CM | POA: Diagnosis not present

## 2018-08-21 DIAGNOSIS — Z79899 Other long term (current) drug therapy: Secondary | ICD-10-CM | POA: Diagnosis not present

## 2018-08-27 DIAGNOSIS — Z1212 Encounter for screening for malignant neoplasm of rectum: Secondary | ICD-10-CM | POA: Diagnosis not present

## 2018-08-28 DIAGNOSIS — E7849 Other hyperlipidemia: Secondary | ICD-10-CM | POA: Diagnosis not present

## 2018-08-28 DIAGNOSIS — Z6822 Body mass index (BMI) 22.0-22.9, adult: Secondary | ICD-10-CM | POA: Diagnosis not present

## 2018-08-28 DIAGNOSIS — Z0000 Encounter for general adult medical examination without abnormal findings: Secondary | ICD-10-CM | POA: Diagnosis not present

## 2018-08-28 DIAGNOSIS — M810 Age-related osteoporosis without current pathological fracture: Secondary | ICD-10-CM | POA: Diagnosis not present

## 2018-08-28 DIAGNOSIS — Z8719 Personal history of other diseases of the digestive system: Secondary | ICD-10-CM | POA: Diagnosis not present

## 2018-08-28 DIAGNOSIS — Z1389 Encounter for screening for other disorder: Secondary | ICD-10-CM | POA: Diagnosis not present

## 2018-12-18 DIAGNOSIS — L821 Other seborrheic keratosis: Secondary | ICD-10-CM | POA: Diagnosis not present

## 2018-12-18 DIAGNOSIS — L820 Inflamed seborrheic keratosis: Secondary | ICD-10-CM | POA: Diagnosis not present

## 2018-12-18 DIAGNOSIS — D225 Melanocytic nevi of trunk: Secondary | ICD-10-CM | POA: Diagnosis not present

## 2018-12-18 DIAGNOSIS — D1801 Hemangioma of skin and subcutaneous tissue: Secondary | ICD-10-CM | POA: Diagnosis not present

## 2018-12-18 DIAGNOSIS — L814 Other melanin hyperpigmentation: Secondary | ICD-10-CM | POA: Diagnosis not present

## 2019-06-30 DIAGNOSIS — R300 Dysuria: Secondary | ICD-10-CM | POA: Diagnosis not present

## 2019-07-09 DIAGNOSIS — Z23 Encounter for immunization: Secondary | ICD-10-CM | POA: Diagnosis not present

## 2019-08-10 ENCOUNTER — Other Ambulatory Visit: Payer: Self-pay

## 2019-08-10 DIAGNOSIS — Z20822 Contact with and (suspected) exposure to covid-19: Secondary | ICD-10-CM

## 2019-08-12 LAB — NOVEL CORONAVIRUS, NAA: SARS-CoV-2, NAA: NOT DETECTED

## 2019-08-24 DIAGNOSIS — K921 Melena: Secondary | ICD-10-CM | POA: Diagnosis not present

## 2019-08-24 DIAGNOSIS — K644 Residual hemorrhoidal skin tags: Secondary | ICD-10-CM | POA: Diagnosis not present

## 2019-08-27 DIAGNOSIS — M810 Age-related osteoporosis without current pathological fracture: Secondary | ICD-10-CM | POA: Diagnosis not present

## 2019-08-27 DIAGNOSIS — E7849 Other hyperlipidemia: Secondary | ICD-10-CM | POA: Diagnosis not present

## 2019-08-27 DIAGNOSIS — R82998 Other abnormal findings in urine: Secondary | ICD-10-CM | POA: Diagnosis not present

## 2019-08-27 DIAGNOSIS — Z833 Family history of diabetes mellitus: Secondary | ICD-10-CM | POA: Diagnosis not present

## 2019-09-03 DIAGNOSIS — F5104 Psychophysiologic insomnia: Secondary | ICD-10-CM | POA: Diagnosis not present

## 2019-09-03 DIAGNOSIS — E785 Hyperlipidemia, unspecified: Secondary | ICD-10-CM | POA: Diagnosis not present

## 2019-09-03 DIAGNOSIS — Z833 Family history of diabetes mellitus: Secondary | ICD-10-CM | POA: Diagnosis not present

## 2019-09-03 DIAGNOSIS — Z1331 Encounter for screening for depression: Secondary | ICD-10-CM | POA: Diagnosis not present

## 2019-09-03 DIAGNOSIS — Z0000 Encounter for general adult medical examination without abnormal findings: Secondary | ICD-10-CM | POA: Diagnosis not present

## 2019-09-03 DIAGNOSIS — Z1339 Encounter for screening examination for other mental health and behavioral disorders: Secondary | ICD-10-CM | POA: Diagnosis not present

## 2019-09-03 DIAGNOSIS — M810 Age-related osteoporosis without current pathological fracture: Secondary | ICD-10-CM | POA: Diagnosis not present

## 2019-11-03 ENCOUNTER — Other Ambulatory Visit: Payer: Self-pay

## 2019-11-03 ENCOUNTER — Ambulatory Visit: Payer: PPO | Attending: Internal Medicine

## 2019-11-03 DIAGNOSIS — Z23 Encounter for immunization: Secondary | ICD-10-CM | POA: Insufficient documentation

## 2019-11-03 NOTE — Progress Notes (Signed)
   Covid-19 Vaccination Clinic  Name:  Kathy Francis    MRN: [id number] DOB: [birth year]  11/03/2019  [name] was observed post Covid-19 immunization for 15 minutes without incidence. She was provided with Vaccine Information Sheet and instruction to access the V-Safe system.   [name] was instructed to call 911 with any severe reactions post vaccine: Marland Kitchen Difficulty breathing  . Swelling of your face and throat  . A fast heartbeat  . A bad rash all over your body  . Dizziness and weakness    Immunizations Administered    Name Date Dose VIS Date Route   Pfizer COVID-19 Vaccine 11/03/2019  1:51 PM 0.3 mL 09/24/2019 Intramuscular   Manufacturer: [place]   Lot: [id number]   [place]: [id number]

## 2019-11-13 ENCOUNTER — Ambulatory Visit: Payer: PPO

## 2019-11-24 ENCOUNTER — Ambulatory Visit: Payer: PPO

## 2019-11-24 ENCOUNTER — Ambulatory Visit: Payer: PPO | Attending: Internal Medicine

## 2019-11-24 DIAGNOSIS — Z23 Encounter for immunization: Secondary | ICD-10-CM

## 2019-11-24 NOTE — Progress Notes (Signed)
   Covid-19 Vaccination Clinic  Name:  Kathy Francis    MRN: [id number] DOB: 10-22-1945  11/24/2019  [name] was observed post Covid-19 immunization for 15 minutes without incidence. She was provided with Vaccine Information Sheet and instruction to access the V-Safe system.   [name] was instructed to call 911 with any severe reactions post vaccine: Marland Kitchen Difficulty breathing  . Swelling of your face and throat  . A fast heartbeat  . A bad rash all over your body  . Dizziness and weakness    Immunizations Administered    Name Date Dose VIS Date Route   Pfizer COVID-19 Vaccine 11/24/2019 12:44 PM 0.3 mL 09/24/2019 Intramuscular   Manufacturer: [place]   Lot: [id number]   [place]: [id number]

## 2020-01-21 DIAGNOSIS — L814 Other melanin hyperpigmentation: Secondary | ICD-10-CM | POA: Diagnosis not present

## 2020-01-21 DIAGNOSIS — D225 Melanocytic nevi of trunk: Secondary | ICD-10-CM | POA: Diagnosis not present

## 2020-01-21 DIAGNOSIS — L820 Inflamed seborrheic keratosis: Secondary | ICD-10-CM | POA: Diagnosis not present

## 2020-01-21 DIAGNOSIS — L821 Other seborrheic keratosis: Secondary | ICD-10-CM | POA: Diagnosis not present

## 2020-01-21 DIAGNOSIS — L84 Corns and callosities: Secondary | ICD-10-CM | POA: Diagnosis not present

## 2020-01-21 DIAGNOSIS — D2262 Melanocytic nevi of left upper limb, including shoulder: Secondary | ICD-10-CM | POA: Diagnosis not present

## 2020-07-05 DIAGNOSIS — L820 Inflamed seborrheic keratosis: Secondary | ICD-10-CM | POA: Diagnosis not present

## 2020-07-26 DIAGNOSIS — Z23 Encounter for immunization: Secondary | ICD-10-CM | POA: Diagnosis not present

## 2020-09-11 DIAGNOSIS — M810 Age-related osteoporosis without current pathological fracture: Secondary | ICD-10-CM | POA: Diagnosis not present

## 2020-09-11 DIAGNOSIS — E785 Hyperlipidemia, unspecified: Secondary | ICD-10-CM | POA: Diagnosis not present

## 2020-09-12 DIAGNOSIS — E785 Hyperlipidemia, unspecified: Secondary | ICD-10-CM | POA: Diagnosis not present

## 2020-09-12 DIAGNOSIS — R82998 Other abnormal findings in urine: Secondary | ICD-10-CM | POA: Diagnosis not present

## 2020-09-20 DIAGNOSIS — Z01419 Encounter for gynecological examination (general) (routine) without abnormal findings: Secondary | ICD-10-CM | POA: Diagnosis not present

## 2020-09-20 DIAGNOSIS — Z1231 Encounter for screening mammogram for malignant neoplasm of breast: Secondary | ICD-10-CM | POA: Diagnosis not present

## 2020-09-26 DIAGNOSIS — Z1212 Encounter for screening for malignant neoplasm of rectum: Secondary | ICD-10-CM | POA: Diagnosis not present

## 2020-09-27 DIAGNOSIS — R3129 Other microscopic hematuria: Secondary | ICD-10-CM | POA: Diagnosis not present

## 2020-09-27 DIAGNOSIS — M810 Age-related osteoporosis without current pathological fracture: Secondary | ICD-10-CM | POA: Diagnosis not present

## 2020-09-27 DIAGNOSIS — Z833 Family history of diabetes mellitus: Secondary | ICD-10-CM | POA: Diagnosis not present

## 2020-09-27 DIAGNOSIS — E785 Hyperlipidemia, unspecified: Secondary | ICD-10-CM | POA: Diagnosis not present

## 2020-09-27 DIAGNOSIS — Z1331 Encounter for screening for depression: Secondary | ICD-10-CM | POA: Diagnosis not present

## 2020-09-27 DIAGNOSIS — Z0000 Encounter for general adult medical examination without abnormal findings: Secondary | ICD-10-CM | POA: Diagnosis not present

## 2020-09-27 DIAGNOSIS — F5104 Psychophysiologic insomnia: Secondary | ICD-10-CM | POA: Diagnosis not present

## 2020-10-03 DIAGNOSIS — R3129 Other microscopic hematuria: Secondary | ICD-10-CM | POA: Diagnosis not present

## 2021-05-30 DIAGNOSIS — H6123 Impacted cerumen, bilateral: Secondary | ICD-10-CM | POA: Diagnosis not present

## 2021-05-30 DIAGNOSIS — H9193 Unspecified hearing loss, bilateral: Secondary | ICD-10-CM | POA: Diagnosis not present

## 2021-07-21 DIAGNOSIS — Z23 Encounter for immunization: Secondary | ICD-10-CM | POA: Diagnosis not present

## 2021-07-23 DIAGNOSIS — D225 Melanocytic nevi of trunk: Secondary | ICD-10-CM | POA: Diagnosis not present

## 2021-07-23 DIAGNOSIS — L814 Other melanin hyperpigmentation: Secondary | ICD-10-CM | POA: Diagnosis not present

## 2021-07-23 DIAGNOSIS — L821 Other seborrheic keratosis: Secondary | ICD-10-CM | POA: Diagnosis not present

## 2021-07-23 DIAGNOSIS — B070 Plantar wart: Secondary | ICD-10-CM | POA: Diagnosis not present

## 2021-10-19 DIAGNOSIS — Z1231 Encounter for screening mammogram for malignant neoplasm of breast: Secondary | ICD-10-CM | POA: Diagnosis not present

## 2021-10-19 DIAGNOSIS — Z01419 Encounter for gynecological examination (general) (routine) without abnormal findings: Secondary | ICD-10-CM | POA: Diagnosis not present

## 2021-11-06 DIAGNOSIS — E785 Hyperlipidemia, unspecified: Secondary | ICD-10-CM | POA: Diagnosis not present

## 2021-11-06 DIAGNOSIS — M810 Age-related osteoporosis without current pathological fracture: Secondary | ICD-10-CM | POA: Diagnosis not present

## 2021-11-12 DIAGNOSIS — R82998 Other abnormal findings in urine: Secondary | ICD-10-CM | POA: Diagnosis not present

## 2021-11-12 DIAGNOSIS — Z1212 Encounter for screening for malignant neoplasm of rectum: Secondary | ICD-10-CM | POA: Diagnosis not present

## 2021-11-13 DIAGNOSIS — Z1331 Encounter for screening for depression: Secondary | ICD-10-CM | POA: Diagnosis not present

## 2021-11-13 DIAGNOSIS — Z1339 Encounter for screening examination for other mental health and behavioral disorders: Secondary | ICD-10-CM | POA: Diagnosis not present

## 2021-11-13 DIAGNOSIS — H6122 Impacted cerumen, left ear: Secondary | ICD-10-CM | POA: Diagnosis not present

## 2021-11-13 DIAGNOSIS — M810 Age-related osteoporosis without current pathological fracture: Secondary | ICD-10-CM | POA: Diagnosis not present

## 2021-11-13 DIAGNOSIS — E785 Hyperlipidemia, unspecified: Secondary | ICD-10-CM | POA: Diagnosis not present

## 2021-11-13 DIAGNOSIS — Z0000 Encounter for general adult medical examination without abnormal findings: Secondary | ICD-10-CM | POA: Diagnosis not present

## 2021-11-13 DIAGNOSIS — F5104 Psychophysiologic insomnia: Secondary | ICD-10-CM | POA: Diagnosis not present

## 2022-01-03 DIAGNOSIS — H43811 Vitreous degeneration, right eye: Secondary | ICD-10-CM | POA: Diagnosis not present

## 2022-01-03 DIAGNOSIS — H2513 Age-related nuclear cataract, bilateral: Secondary | ICD-10-CM | POA: Diagnosis not present

## 2022-01-17 DIAGNOSIS — H25813 Combined forms of age-related cataract, bilateral: Secondary | ICD-10-CM | POA: Diagnosis not present

## 2022-01-17 DIAGNOSIS — H43811 Vitreous degeneration, right eye: Secondary | ICD-10-CM | POA: Diagnosis not present

## 2022-01-17 DIAGNOSIS — H31001 Unspecified chorioretinal scars, right eye: Secondary | ICD-10-CM | POA: Diagnosis not present

## 2022-02-07 DIAGNOSIS — H52203 Unspecified astigmatism, bilateral: Secondary | ICD-10-CM | POA: Diagnosis not present

## 2022-02-07 DIAGNOSIS — H25013 Cortical age-related cataract, bilateral: Secondary | ICD-10-CM | POA: Diagnosis not present

## 2022-02-07 DIAGNOSIS — H43811 Vitreous degeneration, right eye: Secondary | ICD-10-CM | POA: Diagnosis not present

## 2022-02-07 DIAGNOSIS — H2513 Age-related nuclear cataract, bilateral: Secondary | ICD-10-CM | POA: Diagnosis not present

## 2022-07-16 DIAGNOSIS — H2511 Age-related nuclear cataract, right eye: Secondary | ICD-10-CM | POA: Diagnosis not present

## 2022-07-16 DIAGNOSIS — Z961 Presence of intraocular lens: Secondary | ICD-10-CM | POA: Diagnosis not present

## 2022-07-16 DIAGNOSIS — H25811 Combined forms of age-related cataract, right eye: Secondary | ICD-10-CM | POA: Diagnosis not present

## 2022-07-16 DIAGNOSIS — H269 Unspecified cataract: Secondary | ICD-10-CM | POA: Diagnosis not present

## 2022-07-16 DIAGNOSIS — H25011 Cortical age-related cataract, right eye: Secondary | ICD-10-CM | POA: Diagnosis not present

## 2022-07-27 DIAGNOSIS — Z23 Encounter for immunization: Secondary | ICD-10-CM | POA: Diagnosis not present

## 2022-07-30 DIAGNOSIS — L820 Inflamed seborrheic keratosis: Secondary | ICD-10-CM | POA: Diagnosis not present

## 2022-07-30 DIAGNOSIS — D225 Melanocytic nevi of trunk: Secondary | ICD-10-CM | POA: Diagnosis not present

## 2022-07-30 DIAGNOSIS — L570 Actinic keratosis: Secondary | ICD-10-CM | POA: Diagnosis not present

## 2022-07-30 DIAGNOSIS — L814 Other melanin hyperpigmentation: Secondary | ICD-10-CM | POA: Diagnosis not present

## 2022-07-30 DIAGNOSIS — L821 Other seborrheic keratosis: Secondary | ICD-10-CM | POA: Diagnosis not present

## 2022-10-02 DIAGNOSIS — Z1152 Encounter for screening for COVID-19: Secondary | ICD-10-CM | POA: Diagnosis not present

## 2022-10-02 DIAGNOSIS — R059 Cough, unspecified: Secondary | ICD-10-CM | POA: Diagnosis not present

## 2022-10-02 DIAGNOSIS — R0981 Nasal congestion: Secondary | ICD-10-CM | POA: Diagnosis not present

## 2022-10-02 DIAGNOSIS — R5383 Other fatigue: Secondary | ICD-10-CM | POA: Diagnosis not present

## 2022-10-02 DIAGNOSIS — G43909 Migraine, unspecified, not intractable, without status migrainosus: Secondary | ICD-10-CM | POA: Diagnosis not present

## 2022-12-31 DIAGNOSIS — E785 Hyperlipidemia, unspecified: Secondary | ICD-10-CM | POA: Diagnosis not present

## 2022-12-31 DIAGNOSIS — R7989 Other specified abnormal findings of blood chemistry: Secondary | ICD-10-CM | POA: Diagnosis not present

## 2022-12-31 DIAGNOSIS — M810 Age-related osteoporosis without current pathological fracture: Secondary | ICD-10-CM | POA: Diagnosis not present

## 2023-01-01 DIAGNOSIS — R82998 Other abnormal findings in urine: Secondary | ICD-10-CM | POA: Diagnosis not present

## 2023-01-07 DIAGNOSIS — Z833 Family history of diabetes mellitus: Secondary | ICD-10-CM | POA: Diagnosis not present

## 2023-01-07 DIAGNOSIS — Z1339 Encounter for screening examination for other mental health and behavioral disorders: Secondary | ICD-10-CM | POA: Diagnosis not present

## 2023-01-07 DIAGNOSIS — E559 Vitamin D deficiency, unspecified: Secondary | ICD-10-CM | POA: Diagnosis not present

## 2023-01-07 DIAGNOSIS — H6121 Impacted cerumen, right ear: Secondary | ICD-10-CM | POA: Diagnosis not present

## 2023-01-07 DIAGNOSIS — M810 Age-related osteoporosis without current pathological fracture: Secondary | ICD-10-CM | POA: Diagnosis not present

## 2023-01-07 DIAGNOSIS — Z1331 Encounter for screening for depression: Secondary | ICD-10-CM | POA: Diagnosis not present

## 2023-01-07 DIAGNOSIS — Z0000 Encounter for general adult medical examination without abnormal findings: Secondary | ICD-10-CM | POA: Diagnosis not present

## 2023-01-07 DIAGNOSIS — F5104 Psychophysiologic insomnia: Secondary | ICD-10-CM | POA: Diagnosis not present

## 2023-01-07 DIAGNOSIS — E785 Hyperlipidemia, unspecified: Secondary | ICD-10-CM | POA: Diagnosis not present

## 2023-01-15 DIAGNOSIS — Z01419 Encounter for gynecological examination (general) (routine) without abnormal findings: Secondary | ICD-10-CM | POA: Diagnosis not present

## 2023-01-15 DIAGNOSIS — Z780 Asymptomatic menopausal state: Secondary | ICD-10-CM | POA: Diagnosis not present

## 2023-01-15 DIAGNOSIS — Z1231 Encounter for screening mammogram for malignant neoplasm of breast: Secondary | ICD-10-CM | POA: Diagnosis not present

## 2023-03-12 DIAGNOSIS — H2512 Age-related nuclear cataract, left eye: Secondary | ICD-10-CM | POA: Diagnosis not present

## 2023-03-12 DIAGNOSIS — H52203 Unspecified astigmatism, bilateral: Secondary | ICD-10-CM | POA: Diagnosis not present

## 2023-03-12 DIAGNOSIS — Z961 Presence of intraocular lens: Secondary | ICD-10-CM | POA: Diagnosis not present

## 2023-03-12 DIAGNOSIS — H524 Presbyopia: Secondary | ICD-10-CM | POA: Diagnosis not present

## 2023-03-12 DIAGNOSIS — H25012 Cortical age-related cataract, left eye: Secondary | ICD-10-CM | POA: Diagnosis not present

## 2023-03-28 DIAGNOSIS — E559 Vitamin D deficiency, unspecified: Secondary | ICD-10-CM | POA: Diagnosis not present

## 2023-03-28 DIAGNOSIS — I129 Hypertensive chronic kidney disease with stage 1 through stage 4 chronic kidney disease, or unspecified chronic kidney disease: Secondary | ICD-10-CM | POA: Diagnosis not present

## 2023-07-26 DIAGNOSIS — Z23 Encounter for immunization: Secondary | ICD-10-CM | POA: Diagnosis not present

## 2023-09-23 DIAGNOSIS — D1801 Hemangioma of skin and subcutaneous tissue: Secondary | ICD-10-CM | POA: Diagnosis not present

## 2023-09-23 DIAGNOSIS — L820 Inflamed seborrheic keratosis: Secondary | ICD-10-CM | POA: Diagnosis not present

## 2023-09-23 DIAGNOSIS — L570 Actinic keratosis: Secondary | ICD-10-CM | POA: Diagnosis not present

## 2023-09-23 DIAGNOSIS — D225 Melanocytic nevi of trunk: Secondary | ICD-10-CM | POA: Diagnosis not present

## 2023-09-23 DIAGNOSIS — L821 Other seborrheic keratosis: Secondary | ICD-10-CM | POA: Diagnosis not present

## 2023-09-23 DIAGNOSIS — L718 Other rosacea: Secondary | ICD-10-CM | POA: Diagnosis not present

## 2023-09-23 DIAGNOSIS — L814 Other melanin hyperpigmentation: Secondary | ICD-10-CM | POA: Diagnosis not present

## 2023-11-05 DIAGNOSIS — H25012 Cortical age-related cataract, left eye: Secondary | ICD-10-CM | POA: Diagnosis not present

## 2023-11-05 DIAGNOSIS — Z961 Presence of intraocular lens: Secondary | ICD-10-CM | POA: Diagnosis not present

## 2023-11-05 DIAGNOSIS — H2512 Age-related nuclear cataract, left eye: Secondary | ICD-10-CM | POA: Diagnosis not present

## 2023-11-25 DIAGNOSIS — Z961 Presence of intraocular lens: Secondary | ICD-10-CM | POA: Diagnosis not present

## 2023-11-25 DIAGNOSIS — H2512 Age-related nuclear cataract, left eye: Secondary | ICD-10-CM | POA: Diagnosis not present

## 2023-11-25 DIAGNOSIS — H25012 Cortical age-related cataract, left eye: Secondary | ICD-10-CM | POA: Diagnosis not present

## 2023-11-25 DIAGNOSIS — H25812 Combined forms of age-related cataract, left eye: Secondary | ICD-10-CM | POA: Diagnosis not present

## 2023-12-09 DIAGNOSIS — R058 Other specified cough: Secondary | ICD-10-CM | POA: Diagnosis not present

## 2023-12-09 DIAGNOSIS — J209 Acute bronchitis, unspecified: Secondary | ICD-10-CM | POA: Diagnosis not present

## 2024-01-13 DIAGNOSIS — M810 Age-related osteoporosis without current pathological fracture: Secondary | ICD-10-CM | POA: Diagnosis not present

## 2024-01-13 DIAGNOSIS — Z1212 Encounter for screening for malignant neoplasm of rectum: Secondary | ICD-10-CM | POA: Diagnosis not present

## 2024-01-13 DIAGNOSIS — E785 Hyperlipidemia, unspecified: Secondary | ICD-10-CM | POA: Diagnosis not present

## 2024-01-16 DIAGNOSIS — R82998 Other abnormal findings in urine: Secondary | ICD-10-CM | POA: Diagnosis not present

## 2024-01-20 DIAGNOSIS — H6123 Impacted cerumen, bilateral: Secondary | ICD-10-CM | POA: Diagnosis not present

## 2024-01-20 DIAGNOSIS — E785 Hyperlipidemia, unspecified: Secondary | ICD-10-CM | POA: Diagnosis not present

## 2024-01-20 DIAGNOSIS — M810 Age-related osteoporosis without current pathological fracture: Secondary | ICD-10-CM | POA: Diagnosis not present

## 2024-01-20 DIAGNOSIS — E559 Vitamin D deficiency, unspecified: Secondary | ICD-10-CM | POA: Diagnosis not present

## 2024-01-20 DIAGNOSIS — Z1331 Encounter for screening for depression: Secondary | ICD-10-CM | POA: Diagnosis not present

## 2024-01-20 DIAGNOSIS — Z1339 Encounter for screening examination for other mental health and behavioral disorders: Secondary | ICD-10-CM | POA: Diagnosis not present

## 2024-01-20 DIAGNOSIS — J301 Allergic rhinitis due to pollen: Secondary | ICD-10-CM | POA: Diagnosis not present

## 2024-01-20 DIAGNOSIS — I10 Essential (primary) hypertension: Secondary | ICD-10-CM | POA: Diagnosis not present

## 2024-01-20 DIAGNOSIS — Z0000 Encounter for general adult medical examination without abnormal findings: Secondary | ICD-10-CM | POA: Diagnosis not present

## 2024-01-20 DIAGNOSIS — F5104 Psychophysiologic insomnia: Secondary | ICD-10-CM | POA: Diagnosis not present

## 2024-02-24 DIAGNOSIS — Z01419 Encounter for gynecological examination (general) (routine) without abnormal findings: Secondary | ICD-10-CM | POA: Diagnosis not present

## 2024-02-24 DIAGNOSIS — Z780 Asymptomatic menopausal state: Secondary | ICD-10-CM | POA: Diagnosis not present

## 2024-02-24 DIAGNOSIS — Z1231 Encounter for screening mammogram for malignant neoplasm of breast: Secondary | ICD-10-CM | POA: Diagnosis not present

## 2024-03-19 DIAGNOSIS — M5442 Lumbago with sciatica, left side: Secondary | ICD-10-CM | POA: Diagnosis not present

## 2024-07-15 DIAGNOSIS — I10 Essential (primary) hypertension: Secondary | ICD-10-CM | POA: Diagnosis not present

## 2024-07-15 DIAGNOSIS — J040 Acute laryngitis: Secondary | ICD-10-CM | POA: Diagnosis not present

## 2024-07-15 DIAGNOSIS — Z1152 Encounter for screening for COVID-19: Secondary | ICD-10-CM | POA: Diagnosis not present

## 2024-07-15 DIAGNOSIS — R0981 Nasal congestion: Secondary | ICD-10-CM | POA: Diagnosis not present

## 2024-07-15 DIAGNOSIS — J301 Allergic rhinitis due to pollen: Secondary | ICD-10-CM | POA: Diagnosis not present

## 2024-07-19 DIAGNOSIS — H1033 Unspecified acute conjunctivitis, bilateral: Secondary | ICD-10-CM | POA: Diagnosis not present

## 2024-07-26 DIAGNOSIS — H1033 Unspecified acute conjunctivitis, bilateral: Secondary | ICD-10-CM | POA: Diagnosis not present

## 2024-07-31 DIAGNOSIS — Z23 Encounter for immunization: Secondary | ICD-10-CM | POA: Diagnosis not present
